# Patient Record
Sex: Male | Born: 2005 | Race: White | Hispanic: No | Marital: Single | Smoking: Never smoker
Health system: Southern US, Community
[De-identification: ages and names within clinical notes are randomized; demographics above are authoritative.]

## PROBLEM LIST (undated history)

## (undated) DIAGNOSIS — F909 Attention-deficit hyperactivity disorder, unspecified type: Secondary | ICD-10-CM

## (undated) HISTORY — PX: NO PAST SURGERIES: SHX2092

---
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
## 2023-01-26 DIAGNOSIS — Z419 Encounter for procedure for purposes other than remedying health state, unspecified: Secondary | ICD-10-CM | POA: Diagnosis not present

## 2023-05-29 ENCOUNTER — Telehealth: Payer: Self-pay

## 2023-05-29 NOTE — Telephone Encounter (Signed)
LVM. Also sending mychart msg. AS, CMA
